# Patient Record
Sex: Female | Born: 1965 | Race: Black or African American | Hispanic: No | Marital: Single | Smoking: Former smoker
Health system: Southern US, Community
[De-identification: ages and names within clinical notes are randomized; demographics above are authoritative.]

## PROBLEM LIST (undated history)

## (undated) DIAGNOSIS — C801 Malignant (primary) neoplasm, unspecified: Secondary | ICD-10-CM

## (undated) DIAGNOSIS — J40 Bronchitis, not specified as acute or chronic: Secondary | ICD-10-CM

## (undated) DIAGNOSIS — R011 Cardiac murmur, unspecified: Secondary | ICD-10-CM

## (undated) HISTORY — DX: Malignant (primary) neoplasm, unspecified: C80.1

## (undated) HISTORY — PX: OTHER SURGICAL HISTORY: SHX169

## (undated) HISTORY — DX: Cardiac murmur, unspecified: R01.1

## (undated) HISTORY — PX: TUMOR REMOVAL: SHX12

---

## 2012-08-13 DIAGNOSIS — Z0271 Encounter for disability determination: Secondary | ICD-10-CM

## 2012-09-28 ENCOUNTER — Encounter (HOSPITAL_BASED_OUTPATIENT_CLINIC_OR_DEPARTMENT_OTHER): Payer: Self-pay | Admitting: *Deleted

## 2012-09-28 ENCOUNTER — Emergency Department (HOSPITAL_BASED_OUTPATIENT_CLINIC_OR_DEPARTMENT_OTHER)
Admission: EM | Admit: 2012-09-28 | Discharge: 2012-09-28 | Disposition: A | Discharge status: Home / Self Care | Payer: BC Managed Care – PPO | Attending: Emergency Medicine | Admitting: Emergency Medicine

## 2012-09-28 DIAGNOSIS — S139XXA Sprain of joints and ligaments of unspecified parts of neck, initial encounter: Secondary | ICD-10-CM | POA: Insufficient documentation

## 2012-09-28 DIAGNOSIS — S335XXA Sprain of ligaments of lumbar spine, initial encounter: Secondary | ICD-10-CM | POA: Insufficient documentation

## 2012-09-28 DIAGNOSIS — Z8709 Personal history of other diseases of the respiratory system: Secondary | ICD-10-CM | POA: Insufficient documentation

## 2012-09-28 DIAGNOSIS — S0990XA Unspecified injury of head, initial encounter: Secondary | ICD-10-CM | POA: Insufficient documentation

## 2012-09-28 DIAGNOSIS — Z87891 Personal history of nicotine dependence: Secondary | ICD-10-CM | POA: Insufficient documentation

## 2012-09-28 DIAGNOSIS — Y9389 Activity, other specified: Secondary | ICD-10-CM | POA: Insufficient documentation

## 2012-09-28 DIAGNOSIS — Y92410 Unspecified street and highway as the place of occurrence of the external cause: Secondary | ICD-10-CM | POA: Insufficient documentation

## 2012-09-28 HISTORY — DX: Bronchitis, not specified as acute or chronic: J40

## 2012-09-28 MED ORDER — ACETAMINOPHEN 500 MG PO TABS
1000.0000 mg | ORAL_TABLET | Freq: Once | ORAL | Status: AC
  Administered 2012-09-28: 1000 mg via ORAL
  Filled 2012-09-28: qty 2
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
## 2012-10-06 ENCOUNTER — Ambulatory Visit: Payer: BC Managed Care – PPO

## 2012-10-06 ENCOUNTER — Ambulatory Visit (INDEPENDENT_AMBULATORY_CARE_PROVIDER_SITE_OTHER): Payer: BC Managed Care – PPO | Admitting: Internal Medicine

## 2012-10-06 VITALS — BP 104/58 | HR 78 | Temp 97.5°F | Resp 16 | Ht 64.25 in | Wt 158.4 lb

## 2012-10-06 DIAGNOSIS — S161XXA Strain of muscle, fascia and tendon at neck level, initial encounter: Secondary | ICD-10-CM

## 2012-10-06 DIAGNOSIS — S139XXA Sprain of joints and ligaments of unspecified parts of neck, initial encounter: Secondary | ICD-10-CM

## 2012-10-06 DIAGNOSIS — S335XXA Sprain of ligaments of lumbar spine, initial encounter: Secondary | ICD-10-CM

## 2012-10-06 DIAGNOSIS — S39012A Strain of muscle, fascia and tendon of lower back, initial encounter: Secondary | ICD-10-CM

## 2012-10-06 MED ORDER — METHOCARBAMOL 750 MG PO TABS: 750.0000 mg | ORAL_TABLET | Freq: Four times a day (QID) | ORAL | Status: AC

## 2012-10-06 MED ORDER — ACETAMINOPHEN-CODEINE #3 300-30 MG PO TABS: 1.0000 | ORAL_TABLET | ORAL | Status: AC | PRN

## 2012-10-06 MED ORDER — HYDROCODONE-ACETAMINOPHEN 5-325 MG PO TABS: 1.0000 | ORAL_TABLET | Freq: Four times a day (QID) | ORAL | Status: DC | PRN
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
## 2012-10-10 ENCOUNTER — Ambulatory Visit (INDEPENDENT_AMBULATORY_CARE_PROVIDER_SITE_OTHER): Payer: BC Managed Care – PPO | Admitting: Family Medicine

## 2012-10-10 VITALS — BP 110/72 | HR 70 | Temp 98.0°F | Resp 16 | Ht 64.25 in | Wt 156.0 lb

## 2012-10-10 DIAGNOSIS — M542 Cervicalgia: Secondary | ICD-10-CM

## 2012-10-10 DIAGNOSIS — M549 Dorsalgia, unspecified: Secondary | ICD-10-CM

## 2012-10-10 MED ORDER — OXAPROZIN 600 MG PO TABS: ORAL_TABLET | ORAL | Status: AC

## 2012-10-10 MED ORDER — PREDNISONE 10 MG PO TABS: ORAL_TABLET | ORAL | Status: AC
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
## 2012-10-10 NOTE — Patient Instructions (Signed)
Prednisone 3 daily for 2 days, then 2 daily for 2 days, then one daily for 2 days, then one half daily until they're gone  Daypro one twice daily with food since breakfast and supper) for pain and inflammation  You can continue to use the muscle relaxant when needed or Tylenol #3 in the pain is very bad.  We will schedule physical therapy and let him know when that is. Should you not hear from [place] on that over the next  5 days call back.
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
## 2013-10-19 DIAGNOSIS — M67912 Unspecified disorder of synovium and tendon, left shoulder: Secondary | ICD-10-CM | POA: Insufficient documentation

## 2016-05-04 DIAGNOSIS — Z8742 Personal history of other diseases of the female genital tract: Secondary | ICD-10-CM | POA: Insufficient documentation

## 2017-04-14 DIAGNOSIS — R0982 Postnasal drip: Secondary | ICD-10-CM | POA: Insufficient documentation

## 2017-06-22 DIAGNOSIS — H9319 Tinnitus, unspecified ear: Secondary | ICD-10-CM | POA: Insufficient documentation

## 2017-06-22 DIAGNOSIS — R490 Dysphonia: Secondary | ICD-10-CM | POA: Insufficient documentation

## 2017-06-22 DIAGNOSIS — R221 Localized swelling, mass and lump, neck: Secondary | ICD-10-CM | POA: Insufficient documentation

## 2018-07-01 DIAGNOSIS — M659 Synovitis and tenosynovitis, unspecified: Secondary | ICD-10-CM | POA: Insufficient documentation

## 2018-10-05 DIAGNOSIS — F411 Generalized anxiety disorder: Secondary | ICD-10-CM | POA: Insufficient documentation

## 2018-10-05 DIAGNOSIS — F321 Major depressive disorder, single episode, moderate: Secondary | ICD-10-CM | POA: Insufficient documentation

## 2020-04-11 DIAGNOSIS — I10 Essential (primary) hypertension: Secondary | ICD-10-CM | POA: Insufficient documentation

## 2020-04-12 ENCOUNTER — Encounter: Payer: Self-pay | Admitting: Internal Medicine

## 2020-05-24 ENCOUNTER — Telehealth: Payer: Self-pay
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
## 2020-05-24 NOTE — Telephone Encounter (Signed)
No return call to r/s from pt by EOB today.  No show letter sent and procedure cancelled.

## 2020-06-12 ENCOUNTER — Encounter: Payer: Self-pay | Admitting: Internal Medicine

## 2020-08-01 ENCOUNTER — Encounter: Payer: Self-pay | Admitting: Internal Medicine

## 2021-08-08 ENCOUNTER — Encounter: Payer: Self-pay | Admitting: Physician Assistant
